# Patient Record
Sex: Female | Born: 2000 | Race: Black or African American | Hispanic: No | Marital: Single | Smoking: Never smoker
Health system: Southern US, Community
[De-identification: ages and names within clinical notes are randomized; demographics above are authoritative.]

## PROBLEM LIST (undated history)

## (undated) DIAGNOSIS — L309 Dermatitis, unspecified: Secondary | ICD-10-CM

## (undated) DIAGNOSIS — J45909 Unspecified asthma, uncomplicated: Secondary | ICD-10-CM

## (undated) HISTORY — DX: Unspecified asthma, uncomplicated: J45.909

## (undated) HISTORY — DX: Dermatitis, unspecified: L30.9

---
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
## 2021-05-19 ENCOUNTER — Other Ambulatory Visit: Payer: Self-pay

## 2021-05-19 ENCOUNTER — Encounter (HOSPITAL_COMMUNITY): Payer: Self-pay | Admitting: Emergency Medicine

## 2021-05-19 ENCOUNTER — Emergency Department (HOSPITAL_COMMUNITY)
Admission: EM | Admit: 2021-05-19 | Discharge: 2021-05-19 | Disposition: A | Discharge status: Home / Self Care | Payer: 59 | Attending: Emergency Medicine | Admitting: Emergency Medicine

## 2021-05-19 DIAGNOSIS — Z7951 Long term (current) use of inhaled steroids: Secondary | ICD-10-CM | POA: Insufficient documentation

## 2021-05-19 DIAGNOSIS — Z91010 Allergy to peanuts: Secondary | ICD-10-CM | POA: Diagnosis not present

## 2021-05-19 DIAGNOSIS — J4521 Mild intermittent asthma with (acute) exacerbation: Secondary | ICD-10-CM | POA: Insufficient documentation

## 2021-05-19 DIAGNOSIS — R0602 Shortness of breath: Secondary | ICD-10-CM | POA: Diagnosis present

## 2021-05-19 DIAGNOSIS — J4520 Mild intermittent asthma, uncomplicated: Secondary | ICD-10-CM

## 2021-05-19 MED ORDER — ALBUTEROL SULFATE HFA 108 (90 BASE) MCG/ACT IN AERS
2.0000 | INHALATION_SPRAY | Freq: Once | RESPIRATORY_TRACT | Status: AC
  Administered 2021-05-19: 2 via RESPIRATORY_TRACT
  Filled 2021-05-19: qty 6.7

## 2021-05-19 NOTE — Discharge Instructions (Signed)
Can follow-up with wellness clinic. Can continue using inhaler when needed.  8 puffs= 1 neb treatment. Return here for new concerns.

## 2021-05-19 NOTE — ED Triage Notes (Signed)
Pt reports her asthma is acting up and she does not have an inhaler.  No other symptoms at this time.

## 2021-05-19 NOTE — ED Provider Notes (Signed)
[facility] EMERGENCY DEPARTMENT Provider Note   CSN: [id number] Arrival date & time: 05/19/21  0500     History Chief Complaint  Patient presents with   Shortness of Breath    Kathy Stafford is a 20 y.o. female.  The history is provided by the patient and medical records.  Shortness of Breath  20 y.o. F with hx of asthma here with SOB.  States asthma has been acting up recently after the rain.  She does not currently have albuterol inhaler nor primary care doctor.  No recent fever/illness.    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 No pertinent surgical history.   OB History   No obstetric history on file.     Family History  Problem Relation Age of Onset   Lung cancer Maternal Grandmother    Heart disease Maternal Grandmother    Hypertension Maternal Grandmother    Breast cancer Paternal Grandmother    Diabetes Paternal Grandmother    Diabetes Paternal Grandfather     Social History   Tobacco Use   Smoking status: Never   Smokeless tobacco: Never  Vaping Use   Vaping Use: Never used  Substance Use Topics   Alcohol use: No   Drug use: No    Home Medications Prior to Admission medications   Medication Sig Start Date End Date Taking? Authorizing Provider  albuterol (PROAIR HFA) 108 (90 Base) MCG/ACT inhaler Inhale 2 puffs into the lungs every 4 (four) hours as needed. 09/07/15   Kozlow, Alvira Philips, MD  diphenhydrAMINE (BENADRYL) 25 MG tablet Take 2 tablets (50 mg total) by mouth every 6 (six) hours as needed for itching (Please take every 6 hours for the next 24 hours, then every 6 hours as needed.). 08/22/13   Elwin Mocha, MD  EPINEPHrine (AUVI-Q) 0.3 mg/0.3 mL IJ SOAJ injection Use as  directed for severe allergic reactions 07/21/19   Bobbitt, Heywood Iles, MD  fluticasone [facility]) 50 MCG/ACT nasal spray Place 2 sprays into both nostrils daily. 06/11/18   Bobbitt, Heywood Iles, MD  levocetirizine (XYZAL) 5 MG tablet Take 1 tablet (5 mg total) by mouth every evening. 06/11/18   Bobbitt, Heywood Iles, MD  loratadine (CLARITIN) 10 MG tablet Take 10 mg by mouth daily as needed.    [provider]    Allergies    Other and Peanuts [peanut oil]  Review of Systems   Review of Systems  Respiratory:  Positive for shortness of breath.   All other systems reviewed and are negative.  Physical Exam Updated Vital Signs BP 128/85 (BP Location: Right Arm)   Pulse 82   Temp 97.6 F (36.4 C) (Oral)   Resp 16   SpO2 99%   Physical Exam Vitals and nursing note reviewed.  Constitutional:      Appearance: She is well-developed.  HENT:     Head: Normocephalic and atraumatic.  Eyes:     Conjunctiva/sclera: Conjunctivae normal.     Pupils: Pupils are equal, round, and reactive to light.  Cardiovascular:     Rate and Rhythm: Normal rate and regular rhythm.     Heart sounds: Normal heart sounds.  Pulmonary:     Effort: Pulmonary effort is  normal.     Breath sounds: Wheezing present.     Comments: Expiratory wheezes, NAD, speaking in full sentences without difficulty Abdominal:     General: Bowel sounds are normal.     Palpations: Abdomen is soft.  Musculoskeletal:        General: Normal range of motion.     Cervical back: Normal range of motion.  Skin:    General: Skin is warm and dry.  Neurological:     Mental Status: She is alert and oriented to person, place, and time.    ED Results / Procedures / Treatments   Labs (all labs ordered are listed, but only abnormal results are displayed) Labs Reviewed - No data to display  EKG None  Radiology No results found.  Procedures Procedures   Medications Ordered in ED Medications  albuterol (VENTOLIN HFA)  108 (90 Base) MCG/ACT inhaler 2 puff (2 puffs Inhalation Given 05/19/21 0521)    ED Course  I have reviewed the triage vital signs and the nursing notes.  Pertinent labs & imaging results that were available during my care of the patient were reviewed by me and considered in my medical decision making (see chart for details).    MDM Rules/Calculators/A&P                           20 year old female here with asthma exacerbation.  Has been having issues of the past few days since all the rain.  She does not currently have an inhaler nor primary care doctor.  She is afebrile and nontoxic in here.  Her vitals are stable on room air.  She does have some expiratory wheezes but is in no acute respiratory distress.  She was given albuterol inhaler here and use several puffs and reports she is feeling better.  Will refer to wellness clinic for follow-up.  Work note provided.  Return here for new concerns.  Final Clinical Impression(s) / ED Diagnoses Final diagnoses:  Mild intermittent asthma without complication    Rx / DC Orders ED Discharge Orders     None        Garlon Hatchet, PA-C 05/19/21 0534    Zadie Rhine, MD 05/19/21 [phone]

## 2021-07-01 IMAGING — DX DG CHEST 1V PORT
1 series · 1 of 1 positions shown · non-contrast
Comparison: . chest radiograph dated 04/10/2013

CLINICAL DATA: 18-year-old female with hyperventilation

EXAM:
PORTABLE CHEST 1 VIEW

[chest ap]
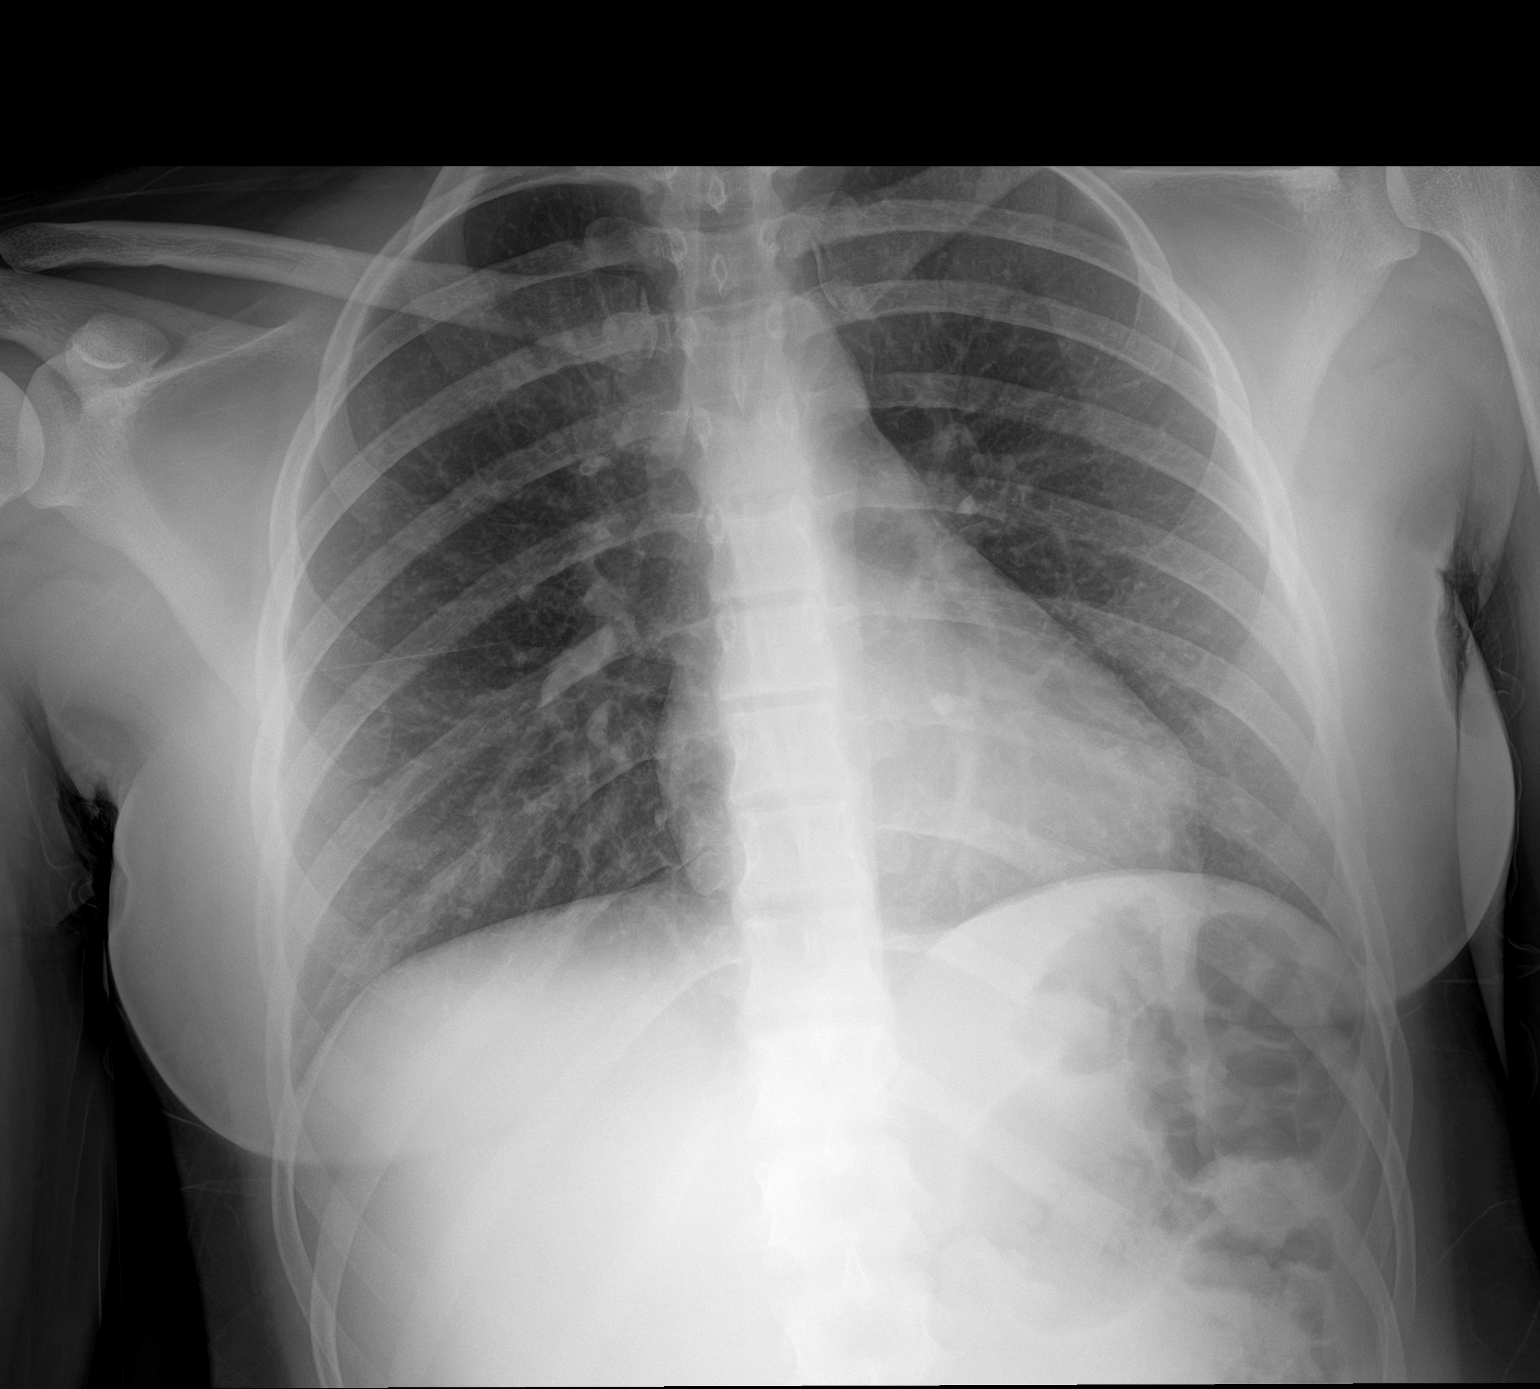

[1 of 1 positions shown; findings below may reference images not displayed]

FINDINGS: The heart size and mediastinal contours are within normal limits.
Both lungs are clear. The visualized skeletal structures are
unremarkable.
IMPRESSION: No active disease.

## 2021-07-26 ENCOUNTER — Emergency Department (HOSPITAL_COMMUNITY)
Admission: EM | Admit: 2021-07-26 | Discharge: 2021-07-26 | Disposition: A | Discharge status: Home / Self Care | Payer: No Typology Code available for payment source | Attending: Emergency Medicine | Admitting: Emergency Medicine

## 2021-07-26 DIAGNOSIS — Z91010 Allergy to peanuts: Secondary | ICD-10-CM | POA: Insufficient documentation

## 2021-07-26 DIAGNOSIS — Z79899 Other long term (current) drug therapy: Secondary | ICD-10-CM | POA: Insufficient documentation

## 2021-07-26 DIAGNOSIS — T782XXA Anaphylactic shock, unspecified, initial encounter: Secondary | ICD-10-CM | POA: Insufficient documentation

## 2021-07-26 DIAGNOSIS — J45909 Unspecified asthma, uncomplicated: Secondary | ICD-10-CM | POA: Insufficient documentation

## 2021-07-26 DIAGNOSIS — R0602 Shortness of breath: Secondary | ICD-10-CM | POA: Diagnosis present

## 2021-07-26 LAB — I-STAT BETA HCG BLOOD, ED (MC, WL, AP ONLY): I-stat hCG, quantitative: <5 m[IU]/mL (ref <5)

## 2021-07-26 MED ORDER — DIPHENHYDRAMINE HCL 50 MG/ML IJ SOLN
25.0000 mg | Freq: Once | INTRAMUSCULAR | Status: AC
  Administered 2021-07-26: 25 mg via INTRAVENOUS
  Filled 2021-07-26: qty 1

## 2021-07-26 MED ORDER — FAMOTIDINE IN NACL 20-0.9 MG/50ML-% IV SOLN
20.0000 mg | Freq: Once | INTRAVENOUS | Status: AC
  Administered 2021-07-26: 20 mg via INTRAVENOUS
  Filled 2021-07-26: qty 50

## 2021-07-26 MED ORDER — EPINEPHRINE 0.3 MG/0.3ML IJ SOAJ: 0.3000 mg | Freq: Once | INTRAMUSCULAR | 1 refills | Status: DC | PRN

## 2021-07-26 NOTE — Discharge Instructions (Addendum)
You were evaluated in the Emergency Department and after careful evaluation, we did not find any emergent condition requiring admission or further testing in the hospital.  Your exam/testing today was overall reassuring. You were treated in the ED for anaphylaxis. After obervsation, your symptoms have improved.  A new prescription for an Epi Pen has been provided.   Please return to the Emergency Department if you experience any worsening of your condition.  Thank you for allowing [place] to be a part of your care.

## 2021-07-26 NOTE — ED Triage Notes (Signed)
Pt BIB [facility] for an allergic reaction. Pt works at [employer] and has a known dust/pollen/peanut allergy.  PT had hives, itching, and swelling of eyelids, tongue and lips. Pt also had some wheezing and nausea.   Pt was given albuterol, epi, Benadryl, zofran, and solu-medrol.  Symptoms had improved but not fully resolved on arrival. Pt was experiencing a resurgence of itching and nausea.

## 2021-07-26 NOTE — ED Provider Notes (Signed)
[facility] EMERGENCY DEPARTMENT Provider Note   CSN: [id number] Arrival date & time: 07/26/21  2001     History No chief complaint on file.   Kathy Stafford is a 20 y.o. female.  The history is provided by the patient.  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   OB History   No obstetric history on file.     Family History  Problem Relation Age of Onset   Lung cancer Maternal Grandmother    Heart disease Maternal Grandmother    Hypertension Maternal Grandmother    Breast cancer Paternal Grandmother    Diabetes Paternal Grandmother    Diabetes Paternal Grandfather     Social History   Tobacco Use   Smoking status: Never   Smokeless tobacco: Never  Vaping Use   Vaping Use: Never used  Substance Use Topics   Alcohol use: No   Drug use: No    Home Medications Prior to Admission medications   Medication Sig Start Date End Date Taking? Authorizing Provider  albuterol (PROAIR HFA) 108 (90 Base) MCG/ACT inhaler Inhale 2 puffs into the lungs every 4 (four) hours as needed. Patient taking differently: Inhale 2 puffs into the lungs every 4 (four) hours as needed for wheezing or shortness of breath. 09/07/15  Yes Kozlow, Alvira Philips, MD  diphenhydrAMINE (BENADRYL) 25 MG tablet Take 2 tablets (50 mg total) by mouth every 6 (six) hours as needed for itching (Please take every 6 hours for the next 24 hours, then every 6  hours as needed.). 08/22/13  Yes Elwin Mocha, MD  fluticasone [facility]) 50 MCG/ACT nasal spray Place 2 sprays into both nostrils daily. Patient taking differently: Place 2 sprays into both nostrils daily as needed for allergies. 06/11/18  Yes Bobbitt, Heywood Iles, MD  guaiFENesin (MUCINEX) 600 MG 12 hr tablet Take 600 mg by mouth 2 (two) times daily as needed for cough.   Yes [provider]  Hyprom-Naphaz-Polysorb-Zn Sulf (CLEAR EYES COMPLETE) SOLN Place 1 drop into both eyes daily as needed (redness).   Yes [provider]  loratadine (CLARITIN) 10 MG tablet Take 10 mg by mouth daily as needed for allergies.   Yes [provider]  EPINEPHrine (AUVI-Q) 0.3 mg/0.3 mL IJ SOAJ injection Inject 0.3 mg into the muscle once as needed for anaphylaxis. Use as directed for severe allergic reactions 07/26/21   Ernie Avena, MD  levocetirizine (XYZAL) 5 MG tablet Take 1 tablet (5 mg total) by mouth every evening. Patient not taking: Reported on 07/26/2021 06/11/18   Bobbitt, Heywood Iles, MD    Allergies    Other and Peanuts [peanut oil]  Review of Systems   Review of Systems  Constitutional:  Negative for chills and fever.  HENT:  Positive for facial swelling. Negative for ear pain and sore throat.   Eyes:  Negative for pain and visual disturbance.  Respiratory:  Positive for wheezing. Negative for cough and shortness of  breath.   Cardiovascular:  Negative for chest pain and palpitations.  Gastrointestinal:  Positive for nausea. Negative for abdominal pain and vomiting.  Genitourinary:  Negative for dysuria and hematuria.  Musculoskeletal:  Negative for arthralgias and back pain.  Skin:  Positive for itching and rash. Negative for color change.  Neurological:  Negative for seizures and syncope.  All other systems reviewed and are negative.  Physical Exam Updated Vital Signs BP 112/65   Pulse 83   Temp 98.8 F (37.1 C) (Oral)   Resp 20   SpO2 100%   Physical  Exam Vitals and nursing note reviewed.  Constitutional:      General: She is not in acute distress.    Appearance: She is well-developed.  HENT:     Head: Normocephalic and atraumatic.     Comments: Left-sided periorbital and eyelid edema, mild left lip swelling Eyes:     Conjunctiva/sclera: Conjunctivae normal.  Cardiovascular:     Rate and Rhythm: Normal rate and regular rhythm.     Heart sounds: No murmur heard. Pulmonary:     Effort: Pulmonary effort is normal. No respiratory distress.     Breath sounds: Normal breath sounds. No wheezing.  Abdominal:     Palpations: Abdomen is soft.     Tenderness: There is no abdominal tenderness.  Musculoskeletal:        General: No swelling.     Cervical back: Neck supple.  Skin:    General: Skin is warm and dry.     Capillary Refill: Capillary refill takes less than 2 seconds.     Findings: Rash present.     Comments: Resolving urticarial rash  Neurological:     Mental Status: She is alert.  Psychiatric:        Mood and Affect: Mood normal.    ED Results / Procedures / Treatments   Labs (all labs ordered are listed, but only abnormal results are displayed) Labs Reviewed  I-STAT BETA HCG BLOOD, ED ([facility], [facility], [facility] ONLY)    EKG None  Radiology No results found.  Procedures .Critical Care Performed by: Regan Lemming, MD Authorized by: Regan Lemming, MD   Critical care provider statement:    Critical care time (minutes):  60   Critical care was necessary to treat or prevent imminent or life-threatening deterioration of the following conditions: Anaphylaxis.   Critical care was time spent personally by me on the following activities:  Examination of patient, obtaining history from patient or surrogate, ordering and performing treatments and interventions and re-evaluation of patient's condition   Medications Ordered in ED Medications  diphenhydrAMINE (BENADRYL) injection 25 mg (25 mg Intravenous Given 07/26/21 2034)   famotidine (PEPCID) IVPB 20 mg premix (0 mg Intravenous Stopped 07/26/21 2108)    ED Course  I have reviewed the triage vital signs and the nursing notes.  Pertinent labs & imaging results that were available during my care of the patient were reviewed by me and considered in my medical decision making (see chart for details).    MDM Rules/Calculators/A&P                           20 year old female presenting to the emergency department with concern for anaphylaxis.  The patient states she has a dust and known peanut allergy.  She had no known exposures today but developed sudden onset urticarial rash with associated itching, swelling of her eyelids, tongue and lips with associated wheezing and nausea.  EMS was called out and the patient was administered an albuterol nebulizer for wheezing, and EpiPen IM and administered Zofran and Solu-Medrol.  She was then transported to [facility] for further care and management where she arrived ABC intact, GCS 15.  She had somewhat improved but persistent eyelid edema and mild lip swelling on arrival.  No noted tongue swelling as it appears to have resolved.  Wheezing has also resolved.  Still endorsing persistent nausea.  IV access was obtained and the patient was administered IV Benadryl and IV Pepcid.  The patient was observed for 3 and half hours in the emergency department with no recurrence of symptoms.  Overall feeling much improved on reassessment.  Stable for discharge with prescription for EpiPen provided.  Final Clinical Impression(s) / ED Diagnoses Final diagnoses:  Anaphylaxis, initial encounter    Rx / DC Orders ED Discharge Orders          Ordered    EPINEPHrine (AUVI-Q) 0.3 mg/0.3 mL IJ SOAJ injection  Once PRN        07/26/21 2329             Regan Lemming, MD 07/26/21 2331

## 2021-07-27 MED ORDER — ALBUTEROL SULFATE HFA 108 (90 BASE) MCG/ACT IN AERS: 2.0000 | INHALATION_SPRAY | Freq: Once | RESPIRATORY_TRACT | Status: DC

## 2022-02-12 ENCOUNTER — Encounter (HOSPITAL_COMMUNITY): Payer: Self-pay | Admitting: Emergency Medicine

## 2022-02-12 ENCOUNTER — Ambulatory Visit (HOSPITAL_COMMUNITY)
Admission: EM | Admit: 2022-02-12 | Discharge: 2022-02-12 | Disposition: A | Discharge status: Home / Self Care | Payer: Self-pay | Attending: Physician Assistant | Admitting: Physician Assistant

## 2022-02-12 DIAGNOSIS — J4541 Moderate persistent asthma with (acute) exacerbation: Secondary | ICD-10-CM

## 2022-02-12 DIAGNOSIS — R0602 Shortness of breath: Secondary | ICD-10-CM

## 2022-02-12 DIAGNOSIS — J069 Acute upper respiratory infection, unspecified: Secondary | ICD-10-CM

## 2022-02-12 MED ORDER — ALBUTEROL SULFATE (2.5 MG/3ML) 0.083% IN NEBU
INHALATION_SOLUTION | RESPIRATORY_TRACT | Status: AC
  Filled 2022-02-12: qty 3

## 2022-02-12 MED ORDER — ALBUTEROL SULFATE (2.5 MG/3ML) 0.083% IN NEBU
2.5000 mg | INHALATION_SOLUTION | RESPIRATORY_TRACT | Status: DC
  Administered 2022-02-12: 2.5 mg via RESPIRATORY_TRACT

## 2022-02-12 MED ORDER — ALBUTEROL SULFATE HFA 108 (90 BASE) MCG/ACT IN AERS: 2.0000 | INHALATION_SPRAY | RESPIRATORY_TRACT | 1 refills | Status: DC | PRN

## 2022-02-12 MED ORDER — TRIAMCINOLONE ACETONIDE 40 MG/ML IJ SUSP
INTRAMUSCULAR | Status: AC
  Filled 2022-02-12: qty 1

## 2022-02-12 MED ORDER — TRIAMCINOLONE ACETONIDE 40 MG/ML IJ SUSP
40.0000 mg | Freq: Once | INTRAMUSCULAR | Status: AC
  Administered 2022-02-12: 40 mg via INTRAMUSCULAR

## 2022-02-12 NOTE — ED Triage Notes (Signed)
Pt reports had asthma attack earlier today and doesn't have inhaler. Pt reports still feels SOB.

## 2022-02-12 NOTE — Discharge Instructions (Addendum)
Advised patient to continue taking Mucinex for the chest congestion. Asked patient to use the albuterol inhaler 2 puffs every 4-6 hours as needed for the wheezing and shortness of breath. Patient to increase the fluid intake. Advised patient follow-up with PCP or return to urgent care if symptoms fail to improve

## 2022-02-12 NOTE — ED Provider Notes (Signed)
[facility]    CSN: [id number] Arrival date & time: 02/12/22  1707      History   Chief Complaint Chief Complaint  Patient presents with   Asthma    HPI Kathy Stafford is a 21 y.o. female.   21 year old female presents with asthma exacerbation shortness of breath.  Patient relates that recently she has had upper respiratory infection which has resolved.  Patient relates that today she started having episode of wheezing, and shortness of breath.  Patient relates that she ran out of her albuterol inhaler so she was not able to use this to help control the asthma flare.  Patient relates that she still continues to have shortness of breath and wheezing, still continues with chest congestion.  Patient relates that her production is thick, and yellow.  Patient relates that she is not having fever or chills, no nausea or vomiting.  Patient relates that she does have some mild fatigue and would like to be able to stay at home tomorrow to be able to treat her respiratory and asthma condition.   Asthma Associated symptoms include shortness of breath.    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 No pertinent surgical history.  OB History   No obstetric history on file.      Home Medications    Prior to Admission medications   Medication Sig Start Date End Date Taking? Authorizing Provider  albuterol (PROAIR HFA) 108 (90 Base) MCG/ACT inhaler Inhale 2 puffs into the lungs every 4 (four) hours as needed for wheezing or shortness of breath. 02/12/22   Ellsworth Lennox, PA-C  diphenhydrAMINE (BENADRYL) 25 MG tablet Take 2 tablets (50 mg total) by mouth every 6 (six) hours as needed for itching (Please take every 6 hours for the next  24 hours, then every 6 hours as needed.). 08/22/13   Elwin Mocha, MD  EPINEPHrine (AUVI-Q) 0.3 mg/0.3 mL IJ SOAJ injection Inject 0.3 mg into the muscle once as needed for anaphylaxis. Use as directed for severe allergic reactions 07/26/21   Ernie Avena, MD  fluticasone [facility]) 50 MCG/ACT nasal spray Place 2 sprays into both nostrils daily. Patient taking differently: Place 2 sprays into both nostrils daily as needed for allergies. 06/11/18   Bobbitt, Heywood Iles, MD  guaiFENesin (MUCINEX) 600 MG 12 hr tablet Take 600 mg by mouth 2 (two) times daily as needed for cough.    [provider]  Hyprom-Naphaz-Polysorb-Zn Sulf (CLEAR EYES COMPLETE) SOLN Place 1 drop into both eyes daily as needed (redness).    [provider]  levocetirizine (XYZAL) 5 MG tablet Take 1 tablet (5 mg total) by mouth every evening. Patient not taking: Reported on 07/26/2021 06/11/18   Bobbitt, Heywood Iles, MD  loratadine (CLARITIN) 10 MG tablet Take 10 mg by mouth daily as needed for allergies.    [provider]    Family History Family History  Problem Relation Age of Onset   Lung cancer Maternal Grandmother    Heart disease Maternal Grandmother    Hypertension Maternal Grandmother    Breast cancer Paternal Grandmother    Diabetes Paternal Grandmother    Diabetes Paternal Grandfather     Social History Social History  Tobacco Use   Smoking status: Never   Smokeless tobacco: Never  Vaping Use   Vaping Use: Never used  Substance Use Topics   Alcohol use: No   Drug use: No     Allergies   Other and Peanuts [peanut oil]   Review of Systems Review of Systems  Respiratory:  Positive for cough, shortness of breath and wheezing.      Physical Exam Triage Vital Signs ED Triage Vitals  Enc Vitals Group     BP 02/12/22 1752 119/79     Pulse Rate 02/12/22 1752 72     Resp 02/12/22 1752 14     Temp 02/12/22 1751 98.6 F (37 C)     Temp src --      SpO2 02/12/22  1752 97 %     Weight --      Height --      Head Circumference --      Peak Flow --      Pain Score 02/12/22 1750 6     Pain Loc --      Pain Edu? --      Excl. in GC? --    No data found.  Updated Vital Signs BP 119/79 (BP Location: Left Arm)   Pulse 72   Temp 98.6 F (37 C)   Resp 14   LMP 01/29/2022   SpO2 97%   Visual Acuity Right Eye Distance:   Left Eye Distance:   Bilateral Distance:    Right Eye Near:   Left Eye Near:    Bilateral Near:     Physical Exam Constitutional:      Appearance: Normal appearance.  HENT:     Right Ear: Tympanic membrane and ear canal normal.     Left Ear: Tympanic membrane and ear canal normal.     Mouth/Throat:     Mouth: Mucous membranes are moist.     Pharynx: Oropharynx is clear. Uvula midline. No posterior oropharyngeal erythema.  Cardiovascular:     Rate and Rhythm: Normal rate and regular rhythm.     Heart sounds: Normal heart sounds.  Pulmonary:     Effort: Pulmonary effort is normal.     Breath sounds: Normal air entry. Examination of the left-upper field reveals wheezing and rhonchi. Examination of the right-middle field reveals wheezing and rhonchi. Wheezing and rhonchi present. No rales.     Comments: Lungs: After albuterol nebulizer treatment the lungs cleared well, the rhonchi and wheezes resolved both lungs all lobes. Neurological:     Mental Status: She is alert.      UC Treatments / Results  Labs (all labs ordered are listed, but only abnormal results are displayed) Labs Reviewed - No data to display  EKG   Radiology No results found.  Procedures Procedures (including critical care time)  Medications Ordered in UC Medications  albuterol (PROVENTIL) (2.5 MG/3ML) 0.083% nebulizer solution 2.5 mg (2.5 mg Nebulization Given 02/12/22 1831)  triamcinolone acetonide (KENALOG-40) injection 40 mg (40 mg Intramuscular Given 02/12/22 1828)    Initial Impression / Assessment and Plan / UC Course  I have  reviewed the triage vital signs and the nursing notes.  Pertinent labs & imaging results that were available during my care of the patient were reviewed by me and considered in my medical decision making (see chart for details).    Plan: 1.  Advised patient to use albuterol inhaler 2 puffs every 4-6 hours as needed for wheezing and shortness of breath to  help control the asthma 2.  Advised to continue using Mucinex to help thin the secretions. 3.  Advised to take Tylenol or ibuprofen if needed for fever. 4.  Advised to follow-up PCP or return to urgent care if symptoms fail to improve. Final Clinical Impressions(s) / UC Diagnoses   Final diagnoses:  Moderate persistent asthma with exacerbation  Shortness of breath  Viral upper respiratory tract infection     Discharge Instructions      Advised patient to continue taking Mucinex for the chest congestion. Asked patient to use the albuterol inhaler 2 puffs every 4-6 hours as needed for the wheezing and shortness of breath. Patient to increase the fluid intake. Advised patient follow-up with PCP or return to urgent care if symptoms fail to improve    ED Prescriptions     Medication Sig Dispense Auth. Provider   albuterol (PROAIR HFA) 108 (90 Base) MCG/ACT inhaler Inhale 2 puffs into the lungs every 4 (four) hours as needed for wheezing or shortness of breath. 18 g Ellsworth Lennox, PA-C      PDMP not reviewed this encounter.   Ellsworth Lennox, PA-C 02/12/22 1859

## 2022-03-05 ENCOUNTER — Ambulatory Visit (HOSPITAL_COMMUNITY)
Admission: EM | Admit: 2022-03-05 | Discharge: 2022-03-05 | Disposition: A | Discharge status: Home / Self Care | Payer: Self-pay | Attending: Family Medicine | Admitting: Family Medicine

## 2022-03-05 ENCOUNTER — Encounter (HOSPITAL_COMMUNITY): Payer: Self-pay | Admitting: *Deleted

## 2022-03-05 ENCOUNTER — Other Ambulatory Visit: Payer: Self-pay

## 2022-03-05 DIAGNOSIS — Z4802 Encounter for removal of sutures: Secondary | ICD-10-CM

## 2022-03-05 NOTE — ED Provider Notes (Signed)
Erroneous encounter. Patient was not evaluated by provider.    Rhys Martini, PA-C 03/05/22 1542

## 2022-03-05 NOTE — ED Triage Notes (Signed)
Pt presents for suture removal on face.

## 2022-03-05 NOTE — Discharge Instructions (Addendum)
Nurse visit only

## 2022-05-02 ENCOUNTER — Encounter: Payer: Self-pay | Admitting: Radiology

## 2023-09-11 ENCOUNTER — Ambulatory Visit
Admission: EM | Admit: 2023-09-11 | Discharge: 2023-09-11 | Disposition: A | Discharge status: Home / Self Care | Payer: Medicaid Other | Attending: Family Medicine | Admitting: Family Medicine

## 2023-09-11 ENCOUNTER — Ambulatory Visit (INDEPENDENT_AMBULATORY_CARE_PROVIDER_SITE_OTHER): Payer: Medicaid Other

## 2023-09-11 DIAGNOSIS — R051 Acute cough: Secondary | ICD-10-CM

## 2023-09-11 DIAGNOSIS — J4521 Mild intermittent asthma with (acute) exacerbation: Secondary | ICD-10-CM

## 2023-09-11 DIAGNOSIS — J209 Acute bronchitis, unspecified: Secondary | ICD-10-CM

## 2023-09-11 LAB — POC COVID19/FLU A&B COMBO
Covid Antigen, POC: NEGATIVE
Influenza A Antigen, POC: NEGATIVE
Influenza B Antigen, POC: NEGATIVE

## 2023-09-11 MED ORDER — AZITHROMYCIN 250 MG PO TABS: 250.0000 mg | ORAL_TABLET | Freq: Every day | ORAL | 0 refills | Status: DC

## 2023-09-11 MED ORDER — PREDNISONE 20 MG PO TABS: 40.0000 mg | ORAL_TABLET | Freq: Every day | ORAL | 0 refills | Status: AC

## 2023-09-11 MED ORDER — PROMETHAZINE-DM 6.25-15 MG/5ML PO SYRP: 5.0000 mL | ORAL_SOLUTION | Freq: Four times a day (QID) | ORAL | 0 refills | Status: DC | PRN

## 2023-09-11 NOTE — ED Provider Notes (Signed)
 [facility]    CSN: [id number] Arrival date & time: 09/11/23  1706      History   Chief Complaint Chief Complaint  Patient presents with   Chills   Nasal Congestion    HPI Kathy Stafford is a 23 y.o. female  presents for evaluation of URI symptoms for 1 days. Patient reports associated symptoms of fever, chills, headache, runny nose, shortness of breath.  Reports she has had a cough for 2 months that is productive and seems to have worsened today.  Denies N/V/D, ear pain, sore throat, body aches. Patient does have a hx of asthma.  Has an albuterol  inhaler that she has been using with temporary improvement in her symptoms.  Patient is not an active smoker.   Reports  sick contacts via work.  Pt has taken nothing OTC for symptoms. Pt has no other concerns at this time.   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 No pertinent surgical history.  OB History   No obstetric history on file.      Home Medications    Prior to Admission medications   Medication Sig Start Date End Date Taking? Authorizing Provider  azithromycin  (ZITHROMAX ) 250 MG tablet Take 1 tablet (250 mg total) by mouth daily. Take first 2 tablets together, then 1 every day until finished. 09/11/23  Yes Avish Torry, Jodi R, NP  predniSONE  (DELTASONE ) 20 MG tablet Take 2 tablets (40 mg total) by mouth daily with breakfast for 5 days. 09/11/23 09/16/23 Yes Kaleb Linquist, Jodi R, NP  promethazine -dextromethorphan (PROMETHAZINE -DM) 6.25-15 MG/5ML syrup Take 5 mLs by mouth 4 (four) times daily as needed for cough. 09/11/23  Yes Astella Desir, Jodi R, NP  albuterol  (PROAIR  HFA) 108 (90 Base) MCG/ACT inhaler Inhale 2 puffs into the lungs every 4 (four) hours as needed for wheezing or  shortness of breath. 02/12/22   Lynwood Lenis, PA-C  diphenhydrAMINE  (BENADRYL ) 25 MG tablet Take 2 tablets (50 mg total) by mouth every 6 (six) hours as needed for itching (Please take every 6 hours for the next 24 hours, then every 6 hours as needed.). 08/22/13   Elpidio Lamp, MD  EPINEPHrine  (AUVI-Q ) 0.3 mg/0.3 mL IJ SOAJ injection Inject 0.3 mg into the muscle once as needed for anaphylaxis. Use as directed for severe allergic reactions 07/26/21   Jerrol Lynwood, MD  fluticasone  (FLONASE ) 50 MCG/ACT nasal spray Place 2 sprays into both nostrils daily. Patient taking differently: Place 2 sprays into both nostrils daily as needed for allergies. 06/11/18   Bobbitt, Elgin Pepper, MD  guaiFENesin  (MUCINEX ) 600 MG 12 hr tablet Take 600 mg by mouth 2 (two) times daily as needed for cough.    [provider]  Hyprom-Naphaz-Polysorb-Zn Sulf (CLEAR EYES COMPLETE) SOLN Place 1 drop into both eyes daily as needed (redness).    [provider]  levocetirizine (XYZAL ) 5 MG tablet Take 1 tablet (5 mg total) by mouth every evening. Patient not taking: Reported on 07/26/2021 06/11/18   Bobbitt, Elgin Pepper, MD  loratadine (CLARITIN) 10 MG tablet Take 10 mg by mouth daily as needed for allergies.    [provider]    Family History Family History  Problem Relation Age of Onset  Lung cancer Maternal Grandmother    Heart disease Maternal Grandmother    Hypertension Maternal Grandmother    Breast cancer Paternal Grandmother    Diabetes Paternal Grandmother    Diabetes Paternal Grandfather     Social History Social History   Tobacco Use   Smoking status: Never   Smokeless tobacco: Never  Vaping Use   Vaping status: Never Used  Substance Use Topics   Alcohol use: No   Drug use: No     Allergies   Other and Peanuts [peanut oil]   Review of Systems Review of Systems  Constitutional:  Positive for chills and fever.  HENT:  Positive for congestion.   Respiratory:   Positive for cough and shortness of breath.   Neurological:  Positive for headaches.     Physical Exam Triage Vital Signs ED Triage Vitals  Encounter Vitals Group     BP 09/11/23 1715 113/72     Systolic BP Percentile --      Diastolic BP Percentile --      Pulse Rate 09/11/23 1715 89     Resp 09/11/23 1715 17     Temp 09/11/23 1715 99.5 F (37.5 C)     Temp Source 09/11/23 1715 Oral     SpO2 09/11/23 1715 98 %     Weight --      Height --      Head Circumference --      Peak Flow --      Pain Score 09/11/23 1714 3     Pain Loc --      Pain Education --      Exclude from Growth Chart --    No data found.  Updated Vital Signs BP 113/72 (BP Location: Right Arm)   Pulse 89   Temp 99.5 F (37.5 C) (Oral)   Resp 17   LMP 08/05/2023 (Exact Date)   SpO2 98%   Visual Acuity Right Eye Distance:   Left Eye Distance:   Bilateral Distance:    Right Eye Near:   Left Eye Near:    Bilateral Near:     Physical Exam Vitals and nursing note reviewed.  Constitutional:      General: She is not in acute distress.    Appearance: She is well-developed. She is not ill-appearing.  HENT:     Head: Normocephalic and atraumatic.     Right Ear: Tympanic membrane and ear canal normal.     Left Ear: Tympanic membrane and ear canal normal.     Nose: Congestion present.     Mouth/Throat:     Mouth: Mucous membranes are moist.     Pharynx: Oropharynx is clear. Uvula midline. No oropharyngeal exudate or posterior oropharyngeal erythema.     Tonsils: No tonsillar exudate or tonsillar abscesses.  Eyes:     Conjunctiva/sclera: Conjunctivae normal.     Pupils: Pupils are equal, round, and reactive to light.  Cardiovascular:     Rate and Rhythm: Normal rate and regular rhythm.     Heart sounds: Normal heart sounds.  Pulmonary:     Effort: Pulmonary effort is normal.     Breath sounds: Normal breath sounds. No wheezing or rhonchi.  Musculoskeletal:     Cervical back: Normal range of  motion and neck supple.  Lymphadenopathy:     Cervical: No cervical adenopathy.  Skin:    General: Skin is warm and dry.  Neurological:     General: No focal deficit present.  Mental Status: She is alert and oriented to person, place, and time.  Psychiatric:        Mood and Affect: Mood normal.        Behavior: Behavior normal.      UC Treatments / Results  Labs (all labs ordered are listed, but only abnormal results are displayed) Labs Reviewed  POC COVID19/FLU A&B COMBO    EKG   Radiology DG Chest 2 View Result Date: 09/11/2023 CLINICAL DATA:  Cough for several months. EXAM: CHEST - 2 VIEW COMPARISON:  X-ray 05/26/2019 and older FINDINGS: Mild bronchial wall thickening. No consolidation, pneumothorax or effusion. Normal cardiopericardial silhouette. IMPRESSION: Mild peribronchial thickening.  No consolidation or effusion Electronically Signed   By: Ranell Bring M.D.   On: 09/11/2023 17:46    Procedures Procedures (including critical care time)  Medications Ordered in UC Medications - No data to display  Initial Impression / Assessment and Plan / UC Course  I have reviewed the triage vital signs and the nursing notes.  Pertinent labs & imaging results that were available during my care of the patient were reviewed by me and considered in my medical decision making (see chart for details).     Reviewed exam and symptoms with patient.  Negative rapid flu and COVID.  Chest x-ray negative for pneumonia does show mild peribronchial thickening.  Given her length of cough for 2 weeks with new onset fever will treat for bronchitis with Zithromax .  Will also treat asthma with prednisone .  She is to continue inhalers as needed.  Promethazine  DM as needed for cough, side effect profile reviewed.  PCP follow-up 2 days for recheck.  Strict ER precautions reviewed. Final Clinical Impressions(s) / UC Diagnoses   Final diagnoses:  Acute cough  Acute bronchitis, unspecified organism   Mild intermittent asthma with acute exacerbation     Discharge Instructions      Start Zithromax  and prednisone  as prescribed.  You may take Promethazine  DM as needed for cough.  Please note this medication can make you drowsy.  Do not drink alcohol or drive while on this medication.  Lots of rest and fluids.  Continue your albuterol  inhaler as needed.  Please follow-up with your PCP in 2 days for recheck.  Please go to the ER for any worsening symptoms.  I hope you feel better soon!     ED Prescriptions     Medication Sig Dispense Auth. Provider   azithromycin  (ZITHROMAX ) 250 MG tablet Take 1 tablet (250 mg total) by mouth daily. Take first 2 tablets together, then 1 every day until finished. 6 tablet Siri Buege, Jodi R, NP   promethazine -dextromethorphan (PROMETHAZINE -DM) 6.25-15 MG/5ML syrup Take 5 mLs by mouth 4 (four) times daily as needed for cough. 118 mL Shekira Drummer, Jodi R, NP   predniSONE  (DELTASONE ) 20 MG tablet Take 2 tablets (40 mg total) by mouth daily with breakfast for 5 days. 10 tablet Ledora Delker, Jodi R, NP      PDMP not reviewed this encounter.   Loreda Myla SAUNDERS, NP 09/11/23 [phone]

## 2023-09-11 NOTE — ED Triage Notes (Signed)
 Pt presents with c/o chills, sweats, runny nose and headaches X this morning.   Has had a cough x 2 months.  Pt states the sxs have progressed through out the day.

## 2023-09-11 NOTE — Discharge Instructions (Signed)
 Start Zithromax  and prednisone  as prescribed.  You may take Promethazine  DM as needed for cough.  Please note this medication can make you drowsy.  Do not drink alcohol or drive while on this medication.  Lots of rest and fluids.  Continue your albuterol  inhaler as needed.  Please follow-up with your PCP in 2 days for recheck.  Please go to the ER for any worsening symptoms.  I hope you feel better soon!

## 2024-01-22 ENCOUNTER — Ambulatory Visit: Admitting: Nurse Practitioner

## 2024-02-04 ENCOUNTER — Ambulatory Visit
Admission: RE | Admit: 2024-02-04 | Discharge: 2024-02-04 | Disposition: A | Discharge status: Home / Self Care | Source: Ambulatory Visit | Attending: Family Medicine | Admitting: Family Medicine

## 2024-02-04 VITALS — BP 124/81 | HR 78 | Temp 98.1°F | Resp 16

## 2024-02-04 DIAGNOSIS — J4531 Mild persistent asthma with (acute) exacerbation: Secondary | ICD-10-CM | POA: Diagnosis not present

## 2024-02-04 MED ORDER — ALBUTEROL SULFATE HFA 108 (90 BASE) MCG/ACT IN AERS: 1.0000 | INHALATION_SPRAY | Freq: Four times a day (QID) | RESPIRATORY_TRACT | 0 refills | Status: AC | PRN

## 2024-02-04 MED ORDER — PREDNISONE 20 MG PO TABS: ORAL_TABLET | ORAL | 0 refills | Status: DC

## 2024-02-04 NOTE — ED Provider Notes (Signed)
 [facility]  Note:  This document was prepared using Conservation officer, historic buildings and may include unintentional dictation errors.  MRN: [id number] DOB: [birth year]  Subjective:   Kathy Stafford is a 23 y.o. female with past medical history of asthma presenting for acute onset of an asthma attack and listening a coughing spell, chest tightness and back pain.  She was unable to go to work today.  She did use an inhaler that helped her but needs a note for her work.  She has had symptoms through the weekend.  No fever, body aches, sinus symptoms.  Patient does vape daily.   No current facility-administered medications for this encounter.  Current Outpatient Medications:    albuterol  (PROAIR  HFA) 108 (90 Base) MCG/ACT inhaler, Inhale 2 puffs into the lungs every 4 (four) hours as needed for wheezing or shortness of breath., Disp: 18 g, Rfl: 1   azithromycin  (ZITHROMAX ) 250 MG tablet, Take 1 tablet (250 mg total) by mouth daily. Take first 2 tablets together, then 1 every day until finished., Disp: 6 tablet, Rfl: 0   diphenhydrAMINE  (BENADRYL ) 25 MG tablet, Take 2 tablets (50 mg total) by mouth every 6 (six) hours as needed for itching (Please take every 6 hours for the next 24 hours, then every 6 hours as needed.)., Disp: 20 tablet, Rfl: 0   EPINEPHrine  (AUVI-Q ) 0.3 mg/0.3 mL IJ SOAJ injection, Inject 0.3 mg into the muscle once as needed for anaphylaxis. Use as directed for severe allergic reactions, Disp: 1 each, Rfl: 1   fluticasone  (FLONASE ) 50 MCG/ACT nasal spray, Place 2 sprays into both nostrils daily. (Patient taking differently: Place 2 sprays into both nostrils daily as needed for allergies.), Disp: 16 g, Rfl: 5   guaiFENesin  (MUCINEX ) 600 MG 12 hr tablet, Take 600 mg by mouth 2 (two) times daily as needed for cough., Disp: , Rfl:    Hyprom-Naphaz-Polysorb-Zn Sulf (CLEAR EYES COMPLETE) SOLN, Place 1 drop into both eyes daily as needed (redness)., Disp: , Rfl:     levocetirizine (XYZAL ) 5 MG tablet, Take 1 tablet (5 mg total) by mouth every evening. (Patient not taking: Reported on 07/26/2021), Disp: 30 tablet, Rfl: 5   loratadine (CLARITIN) 10 MG tablet, Take 10 mg by mouth daily as needed for allergies., Disp: , Rfl:    promethazine -dextromethorphan (PROMETHAZINE -DM) 6.25-15 MG/5ML syrup, Take 5 mLs by mouth 4 (four) times daily as needed for cough., Disp: 118 mL, Rfl: 0   Allergies  Allergen Reactions   Other Anaphylaxis    Tree Nuts   Peanuts [Peanut Oil] Anaphylaxis    Past Medical History:  Diagnosis Date   Asthma    Eczema      History reviewed. No pertinent surgical history.  Family History  Problem Relation Age of Onset   Lung cancer Maternal Grandmother    Heart disease Maternal Grandmother    Hypertension Maternal Grandmother    Breast cancer Paternal Grandmother    Diabetes Paternal Grandmother    Diabetes Paternal Grandfather     Social History   Tobacco Use   Smoking status: Never   Smokeless tobacco: Never  Vaping Use   Vaping status: Every Day  Substance Use Topics   Alcohol use: Not Currently   Drug use: No    ROS   Objective:   Vitals: BP 124/81 (BP Location: Left Arm)   Pulse 78   Temp 98.1 F (36.7 C) (Oral)   Resp 16   LMP 01/17/2024   SpO2  99%   Physical Exam Constitutional:      General: She is not in acute distress.    Appearance: Normal appearance. She is well-developed. She is not ill-appearing, toxic-appearing or diaphoretic.  HENT:     Head: Normocephalic and atraumatic.     Right Ear: External ear normal.     Left Ear: External ear normal.     Nose: Nose normal.     Mouth/Throat:     Mouth: Mucous membranes are moist.     Pharynx: No pharyngeal swelling, oropharyngeal exudate, posterior oropharyngeal erythema or uvula swelling.     Tonsils: No tonsillar exudate or tonsillar abscesses. 0 on the right. 0 on the left.  Eyes:     General: No scleral icterus.       Right eye: No  discharge.        Left eye: No discharge.     Extraocular Movements: Extraocular movements intact.  Cardiovascular:     Rate and Rhythm: Normal rate and regular rhythm.     Heart sounds: Normal heart sounds. No murmur heard.    No friction rub. No gallop.  Pulmonary:     Effort: Pulmonary effort is normal. No respiratory distress.     Breath sounds: No stridor. No wheezing, rhonchi or rales.  Chest:     Chest wall: No tenderness.  Skin:    General: Skin is warm and dry.  Neurological:     General: No focal deficit present.     Mental Status: She is alert and oriented to person, place, and time.  Psychiatric:        Mood and Affect: Mood normal.        Behavior: Behavior normal.     Assessment and Plan :   PDMP not reviewed this encounter.  1. Mild persistent asthma with (acute) exacerbation    Recommended oral prednisone  course, refilled her albuterol  inhaler. Deferred imaging given clear cardiopulmonary exam, hemodynamically stable vital signs.  Counseled patient on potential for adverse effects with medications prescribed/recommended today, ER and return-to-clinic precautions discussed, patient verbalized understanding.    Adolph Hoop, [place] 02/04/24 1232

## 2024-02-04 NOTE — ED Triage Notes (Signed)
 Pt requested RTW note-states she had a bad asthma attack early this am and unable to work-states she got some relief with inhaler/does not know med name-pt NAD-steady gait

## 2024-02-05 ENCOUNTER — Ambulatory Visit: Payer: Self-pay

## 2024-02-14 ENCOUNTER — Encounter: Admitting: Obstetrics and Gynecology

## 2024-02-19 ENCOUNTER — Ambulatory Visit: Payer: Self-pay | Admitting: Urgent Care

## 2024-02-19 ENCOUNTER — Ambulatory Visit
Admission: EM | Admit: 2024-02-19 | Discharge: 2024-02-19 | Disposition: A | Discharge status: Home / Self Care | Attending: Family Medicine | Admitting: Family Medicine

## 2024-02-19 ENCOUNTER — Ambulatory Visit (INDEPENDENT_AMBULATORY_CARE_PROVIDER_SITE_OTHER)

## 2024-02-19 DIAGNOSIS — M25511 Pain in right shoulder: Secondary | ICD-10-CM

## 2024-02-19 MED ORDER — IBUPROFEN 600 MG PO TABS: 600.0000 mg | ORAL_TABLET | Freq: Four times a day (QID) | ORAL | 0 refills | Status: AC | PRN

## 2024-02-19 MED ORDER — CYCLOBENZAPRINE HCL 5 MG PO TABS: 5.0000 mg | ORAL_TABLET | Freq: Every evening | ORAL | 0 refills | Status: DC | PRN

## 2024-02-19 NOTE — ED Provider Notes (Signed)
 [facility]  Note:  This document was prepared using Conservation officer, historic buildings and may include unintentional dictation errors.  MRN: [id number] DOB: 27-Jun-2001  Subjective:   Kathy Stafford is a 23 y.o. female presenting for acute onset since this morning of moderate to severe right shoulder pain with decreased range of motion.  No fall, trauma.  Patient reports that symptoms started while she was trying to put on her shirt.  Reports that she dislocated her shoulder 1 year ago.  This is by her own personal experience that she did not seek medical treatment.  She has since had intermittent popping of her shoulder with mild to moderate pain.  What brings her into the clinic today is the level of her pain.  No swelling, rashes, fever, wounds, drainage of pus or bleeding, history of musculoskeletal disorders.  No current facility-administered medications for this encounter.  Current Outpatient Medications:    albuterol  (VENTOLIN  HFA) 108 (90 Base) MCG/ACT inhaler, Inhale 1-2 puffs into the lungs every 6 (six) hours as needed for wheezing or shortness of breath., Disp: 18 g, Rfl: 0   azithromycin  (ZITHROMAX ) 250 MG tablet, Take 1 tablet (250 mg total) by mouth daily. Take first 2 tablets together, then 1 every day until finished., Disp: 6 tablet, Rfl: 0   diphenhydrAMINE  (BENADRYL ) 25 MG tablet, Take 2 tablets (50 mg total) by mouth every 6 (six) hours as needed for itching (Please take every 6 hours for the next 24 hours, then every 6 hours as needed.)., Disp: 20 tablet, Rfl: 0   EPINEPHrine  (AUVI-Q ) 0.3 mg/0.3 mL IJ SOAJ injection, Inject 0.3 mg into the muscle once as needed for anaphylaxis. Use as directed for severe allergic reactions, Disp: 1 each, Rfl: 1   fluticasone  (FLONASE ) 50 MCG/ACT nasal spray, Place 2 sprays into both nostrils daily. (Patient taking differently: Place 2 sprays into both nostrils daily as needed for allergies.), Disp: 16 g, Rfl: 5    guaiFENesin  (MUCINEX ) 600 MG 12 hr tablet, Take 600 mg by mouth 2 (two) times daily as needed for cough., Disp: , Rfl:    Hyprom-Naphaz-Polysorb-Zn Sulf (CLEAR EYES COMPLETE) SOLN, Place 1 drop into both eyes daily as needed (redness)., Disp: , Rfl:    levocetirizine (XYZAL ) 5 MG tablet, Take 1 tablet (5 mg total) by mouth every evening. (Patient not taking: Reported on 07/26/2021), Disp: 30 tablet, Rfl: 5   loratadine (CLARITIN) 10 MG tablet, Take 10 mg by mouth daily as needed for allergies., Disp: , Rfl:    predniSONE  (DELTASONE ) 20 MG tablet, Take 2 tablets daily with breakfast., Disp: 10 tablet, Rfl: 0   promethazine -dextromethorphan (PROMETHAZINE -DM) 6.25-15 MG/5ML syrup, Take 5 mLs by mouth 4 (four) times daily as needed for cough., Disp: 118 mL, Rfl: 0   Allergies  Allergen Reactions   Other Anaphylaxis    Tree Nuts   Peanuts [Peanut Oil] Anaphylaxis    Past Medical History:  Diagnosis Date   Asthma    Eczema      History reviewed. No pertinent surgical history.  Family History  Problem Relation Age of Onset   Lung cancer Maternal Grandmother    Heart disease Maternal Grandmother    Hypertension Maternal Grandmother    Breast cancer Paternal Grandmother    Diabetes Paternal Grandmother    Diabetes Paternal Grandfather     Social History   Tobacco Use   Smoking status: Never   Smokeless tobacco: Never  Vaping Use   Vaping status: Every  Day  Substance Use Topics   Alcohol use: Not Currently   Drug use: No    ROS   Objective:   Vitals: BP 115/67 (BP Location: Left Arm)   Pulse 66   Temp 98.7 F (37.1 C) (Oral)   Resp 16   LMP 02/17/2024   SpO2 99%   Physical Exam Constitutional:      General: She is not in acute distress.    Appearance: Normal appearance. She is well-developed. She is not ill-appearing, toxic-appearing or diaphoretic.  HENT:     Head: Normocephalic and atraumatic.     Nose: Nose normal.     Mouth/Throat:     Mouth: Mucous  membranes are moist.   Eyes:     General: No scleral icterus.       Right eye: No discharge.        Left eye: No discharge.     Extraocular Movements: Extraocular movements intact.    Cardiovascular:     Rate and Rhythm: Normal rate.  Pulmonary:     Effort: Pulmonary effort is normal.   Musculoskeletal:     Comments: Extensive guarding of the right shoulder, physical exam not feasible.   Skin:    General: Skin is warm and dry.   Neurological:     General: No focal deficit present.     Mental Status: She is alert and oriented to person, place, and time.   Psychiatric:        Mood and Affect: Mood normal.        Behavior: Behavior normal.    DG Shoulder Right Result Date: 02/19/2024 CLINICAL DATA:  Right shoulder pain. EXAM: RIGHT SHOULDER - 2+ VIEW COMPARISON:  None Available. FINDINGS: Normal glenohumeral acromioclavicular alignment. Joint spaces are maintained. No acute fracture or dislocation. The cortices are intact. The visualized portion of the right lung is unremarkable. IMPRESSION: Normal right shoulder radiographs. Electronically Signed   By: Bertina Broccoli M.D.   On: 02/19/2024 09:51     Assessment and Plan :   PDMP not reviewed this encounter.  1. Acute pain of right shoulder    Patient has acute on chronic right shoulder pain.  Recommend starting ibuprofen , following up with an orthopedist for further management and consideration for more imaging.  Counseled patient on potential for adverse effects with medications prescribed/recommended today, ER and return-to-clinic precautions discussed, patient verbalized understanding.    Adolph Hoop, [place] 02/19/24 [phone]

## 2024-02-19 NOTE — ED Triage Notes (Signed)
 Pt c/o right shoulder pain with hx of dislocation-felt shoulder pop this am while putting in her shirt-NAD-steady gait

## 2024-02-23 ENCOUNTER — Emergency Department (HOSPITAL_COMMUNITY)
Admission: EM | Admit: 2024-02-23 | Discharge: 2024-02-23 | Disposition: A | Discharge status: Home / Self Care | Attending: Emergency Medicine | Admitting: Emergency Medicine

## 2024-02-23 ENCOUNTER — Other Ambulatory Visit: Payer: Self-pay

## 2024-02-23 ENCOUNTER — Encounter (HOSPITAL_COMMUNITY): Payer: Self-pay

## 2024-02-23 DIAGNOSIS — Z91010 Allergy to peanuts: Secondary | ICD-10-CM | POA: Diagnosis not present

## 2024-02-23 DIAGNOSIS — Z7951 Long term (current) use of inhaled steroids: Secondary | ICD-10-CM | POA: Diagnosis not present

## 2024-02-23 DIAGNOSIS — J45909 Unspecified asthma, uncomplicated: Secondary | ICD-10-CM | POA: Diagnosis not present

## 2024-02-23 DIAGNOSIS — R000 Tachycardia, unspecified: Secondary | ICD-10-CM | POA: Diagnosis not present

## 2024-02-23 DIAGNOSIS — L500 Allergic urticaria: Secondary | ICD-10-CM | POA: Insufficient documentation

## 2024-02-23 DIAGNOSIS — T7840XA Allergy, unspecified, initial encounter: Secondary | ICD-10-CM

## 2024-02-23 MED ORDER — METHYLPREDNISOLONE SODIUM SUCC 125 MG IJ SOLR
125.0000 mg | INTRAMUSCULAR | Status: AC
  Administered 2024-02-23: 125 mg via INTRAVENOUS
  Filled 2024-02-23: qty 2

## 2024-02-23 MED ORDER — EPINEPHRINE 0.3 MG/0.3ML IJ SOAJ
0.3000 mg | Freq: Once | INTRAMUSCULAR | Status: AC
Start: 2024-02-23 — End: 2024-02-23

## 2024-02-23 MED ORDER — SODIUM CHLORIDE 0.9 % IV BOLUS
1000.0000 mL | Freq: Once | INTRAVENOUS | Status: AC
  Administered 2024-02-23: 1000 mL via INTRAVENOUS

## 2024-02-23 MED ORDER — FAMOTIDINE IN NACL 20-0.9 MG/50ML-% IV SOLN
20.0000 mg | Freq: Once | INTRAVENOUS | Status: AC
  Administered 2024-02-23: 20 mg via INTRAVENOUS
  Filled 2024-02-23: qty 50

## 2024-02-23 MED ORDER — EPINEPHRINE 0.3 MG/0.3ML IJ SOAJ
INTRAMUSCULAR | Status: AC
  Administered 2024-02-23: 0.3 mg via INTRAMUSCULAR
  Filled 2024-02-23: qty 0.3

## 2024-02-23 MED ORDER — EPINEPHRINE 0.3 MG/0.3ML IJ SOAJ: 0.3000 mg | INTRAMUSCULAR | 0 refills | Status: AC | PRN

## 2024-02-23 MED ORDER — DIPHENHYDRAMINE HCL 50 MG/ML IJ SOLN
25.0000 mg | Freq: Once | INTRAMUSCULAR | Status: AC
Start: 2024-02-23 — End: 2024-02-23
  Administered 2024-02-23: 25 mg via INTRAVENOUS
  Filled 2024-02-23: qty 1

## 2024-02-23 MED ORDER — IPRATROPIUM-ALBUTEROL 0.5-2.5 (3) MG/3ML IN SOLN
3.0000 mL | Freq: Once | RESPIRATORY_TRACT | Status: AC
  Administered 2024-02-23: 3 mL via RESPIRATORY_TRACT
  Filled 2024-02-23: qty 3

## 2024-02-23 NOTE — ED Triage Notes (Signed)
 Patient reports sudden onset generalized skin itching with urticaria and redness / facial numbness this evening after drinking alcohol .

## 2024-02-23 NOTE — ED Provider Notes (Signed)
 [place] [facility]PARTMENT AT [facility] Provider Note   CSN: [id number] Arrival date & time: 02/23/24  9980     Patient presents with: Allergic Reaction   Kathy Stafford is a 23 y.o. 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  The patient presents to the ED for evaluation of allergic reaction.  States that she was at the bar with her friend where they were having drinks that had tequila and then when she had sudden onset of facial swelling, hives throughout her upper extremities.  She reports a history of anaphylaxis in the past to tree nuts.  She reports being seen by allergist in the past.  She denies any recent exposures to new lotions, detergents, body washes.  Denies any new food exposures.  Reports a history of anaphylaxis.  Reports that she currently feels as if her throat is closing and she is short of breath.  She is actively wheezing on exam.  Denies any drug use tonight.   Allergic Reaction Presenting symptoms: difficulty swallowing and rash        Prior to Admission medications   Medication Sig Start Date End Date Taking? Authorizing Provider  EPINEPHrine  0.3 mg/0.3 mL IJ SOAJ injection Inject 0.3 mg into the muscle as needed for anaphylaxis. 02/23/24  Yes Ruthell Lonni FALCON, PA-C  albuterol  (VENTOLIN  HFA) 108 (90 Base) MCG/ACT inhaler Inhale 1-2 puffs into the lungs every 6 (six) hours as needed for wheezing or shortness of breath. 02/04/24   Gjon Letarte Savannah, PA-C  azithromycin  (ZITHROMAX ) 250 MG tablet Take 1 tablet (250 mg total) by mouth daily. Take first 2 tablets together, then 1 every day until finished. 09/11/23   Mayer, Jodi R, NP  cyclobenzaprine (FLEXERIL) 5 MG tablet Take 1 tablet (5 mg total) by mouth at bedtime as needed. 02/19/24   Deberah Adolf Savannah, PA-C  diphenhydrAMINE  (BENADRYL ) 25 MG tablet Take 2 tablets (50 mg total) by mouth every 6 (six) hours as needed for itching (Please take every 6 hours for the next 24 hours, then  every 6 hours as needed.). 08/22/13   Elpidio Lamp, MD  fluticasone  (FLONASE ) 50 MCG/ACT nasal spray Place 2 sprays into both nostrils daily. Patient taking differently: Place 2 sprays into both nostrils daily as needed for allergies. 06/11/18   Bobbitt, Elgin Pepper, MD  guaiFENesin  (MUCINEX ) 600 MG 12 hr tablet Take 600 mg by mouth 2 (two) times daily as needed for cough.    [provider]  Hyprom-Naphaz-Polysorb-Zn Sulf (CLEAR EYES COMPLETE) SOLN Place 1 drop into both eyes daily as needed (redness).    [provider]  ibuprofen  (ADVIL ) 600 MG tablet Take 1 tablet (600 mg total) by mouth every 6 (six) hours as needed. 02/19/24   Znya Albino Savannah, PA-C  levocetirizine (XYZAL ) 5 MG tablet Take 1 tablet (5 mg total) by mouth every evening. Patient not taking: Reported on 07/26/2021 06/11/18   Bobbitt, Elgin Pepper, MD  loratadine (CLARITIN) 10 MG tablet Take 10 mg by mouth daily as needed for allergies.    [provider]  predniSONE  (DELTASONE ) 20 MG tablet Take 2 tablets daily with breakfast. 02/04/24   Kamali Nephew Savannah, PA-C  promethazine -dextromethorphan (PROMETHAZINE -DM) 6.25-15 MG/5ML syrup Take 5 mLs by mouth 4 (four) times daily as needed for cough. 09/11/23   Mayer, Jodi R, NP    Allergies: Other and Peanuts [peanut oil]    Review of Systems  HENT:  Positive for trouble swallowing.   Respiratory:  Positive for shortness  of breath.   Cardiovascular:  Negative for chest pain.  Gastrointestinal:  Negative for nausea and vomiting.  Skin:  Positive for rash.  All other systems reviewed and are negative.   Updated Vital Signs BP 127/89   Pulse 88   Temp 97.8 F (36.6 C)   Resp 17   Ht 5' 7 (1.702 m)   Wt 70.6 kg   LMP 02/17/2024   SpO2 100%   BMI 24.38 kg/m   Physical Exam Vitals and nursing note reviewed.  Constitutional:      General: She is not in acute distress.    Appearance: She is well-developed.  HENT:     Head: Normocephalic and atraumatic.      Mouth/Throat:     Comments: Uvula midline.  Handling secretions appropriately.  No drooling or change phonation.  Eyes:     Conjunctiva/sclera: Conjunctivae normal.    Cardiovascular:     Rate and Rhythm: Regular rhythm. Tachycardia present.     Heart sounds: No murmur heard. Pulmonary:     Effort: Pulmonary effort is normal. No respiratory distress.     Breath sounds: Normal breath sounds.  Abdominal:     Palpations: Abdomen is soft.     Tenderness: There is no abdominal tenderness.   Musculoskeletal:        General: No swelling.     Cervical back: Neck supple.   Skin:    General: Skin is warm and dry.     Capillary Refill: Capillary refill takes less than 2 seconds.     Comments: Urticarial hives to bilateral upper extremities   Neurological:     Mental Status: She is alert and oriented to person, place, and time. Mental status is at baseline.   Psychiatric:        Mood and Affect: Mood normal.     (all labs ordered are listed, but only abnormal results are displayed) Labs Reviewed - No data to display  EKG: None  Radiology: No results found.   Procedures   Medications Ordered in the ED  EPINEPHrine  (EPI-PEN) injection 0.3 mg (0.3 mg Intramuscular Given 02/23/24 0045)  methylPREDNISolone  sodium succinate (SOLU-MEDROL ) 125 mg/2 mL injection 125 mg (125 mg Intravenous Given 02/23/24 0043)  famotidine  (PEPCID ) IVPB 20 mg premix (0 mg Intravenous Stopped 02/23/24 0113)  diphenhydrAMINE  (BENADRYL ) injection 25 mg (25 mg Intravenous Given 02/23/24 0043)  sodium chloride 0.9 % bolus 1,000 mL (0 mLs Intravenous Stopped 02/23/24 0150)  ipratropium-albuterol  (DUONEB) 0.5-2.5 (3) MG/3ML nebulizer solution 3 mL (3 mLs Nebulization Given 02/23/24 0044)    Clinical Course as of 02/23/24 0356  Sat Feb 23, 2024  0135 Reports symptoms are resolved at this time.  Patient will be observed for 4 hours. [CG]  0135 Can be discharged at 4:45 AM. [CG]    Clinical Course User  Index [CG] Ruthell Lonni FALCON, PA-C   Medical Decision Making Risk Prescription drug management.   23 year old female presents for evaluation.  Please see HPI for further details.  On examination the patient is afebrile, tachycardic.  Her lung sounds are clear bilaterally, she is not hypoxic.  Her abdomen is soft and compressible.  Neurological examination at baseline.  Uvula midline, handling secretions appropriately, no drooling, no change phonation.  Urticarial hives present to bilateral per extremities.  Patient presents stating she has history of anaphylaxis.  Reports that she feels as if her throat is closing.  Patient was given 0.3 mg IM epinephrine  at this time.  This was completed at  12:45 AM.  Patient was then given Solu-Medrol , Benadryl  and famotidine .  Patient was observed for 3 hours and 35 minutes.  Patient requests discharge at this time.  I advised the patient that typically we observe patient for 4 hours to ensure no rebound symptoms however she is still requesting discharge.  She has had resolution of her symptoms here.  She states she has no trouble swallowing, feelings of her throat closing.  She states that her hives have resolved.  Patient be discharged home at this time.  Will send patient home with epinephrine  pen.  She was advised to follow-up with her PCP and allergy team and she voiced understanding.  She is stable to discharge    Final diagnoses:  Allergic reaction, initial encounter    ED Discharge Orders          Ordered    EPINEPHrine  0.3 mg/0.3 mL IJ SOAJ injection  As needed        02/23/24 0356               Ruthell Lonni FALCON, PA-C 02/23/24 9643    Geroldine Berg, MD 02/23/24 [phone]

## 2024-02-23 NOTE — Discharge Instructions (Signed)
 It was a pleasure taking part in your care.  As discussed, please follow-up with your PCP and allergy team for further management and care.  I have discharged you with an EpiPen .  Please pick this up at your earliest convenience.  Return to the ED with any new or worsening symptoms.

## 2024-07-17 ENCOUNTER — Ambulatory Visit: Admitting: Family Medicine

## 2024-07-21 ENCOUNTER — Telehealth

## 2024-07-21 DIAGNOSIS — L710 Perioral dermatitis: Secondary | ICD-10-CM

## 2024-07-21 DIAGNOSIS — T380X5A Adverse effect of glucocorticoids and synthetic analogues, initial encounter: Secondary | ICD-10-CM | POA: Diagnosis not present

## 2024-07-21 MED ORDER — METRONIDAZOLE 0.75 % EX CREA: TOPICAL_CREAM | Freq: Two times a day (BID) | CUTANEOUS | 0 refills | Status: DC

## 2024-07-21 NOTE — Progress Notes (Signed)
 We are sorry that you are experiencing this issue.  Here is how we plan to help!  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   Your personal care plan consists of the following recommendations:  I have prescribed a topical antibiotic treatment: Metronidazole 0.75% cream Apply topically twice daily   HOME CARE: Do not squeeze pimples because that can often lead to infections, worse acne, and scars. Use a moisturizer that contains retinoid or fruit acids that may inhibit the development of new acne lesions. Although there is not a clear link that foods can cause acne, doctors do believe that too many sweets predispose you to skin problems.  GET HELP RIGHT AWAY IF: If your acne gets worse or is not better within 10 days. If you become depressed. If you become pregnant, discontinue medications and call your OB/GYN.  MAKE SURE YOU: Understand these instructions. Will watch your condition. Will get help right away if you are not doing well or get worse.   Your e-visit answers were reviewed by a board certified advanced clinical practitioner to complete your personal care plan.  Depending upon the condition, your plan could have included both over the counter or prescription medications.  Please review your pharmacy choice.  If there is a problem, you may contact your provider through [employer] and have the prescription routed to another pharmacy.  Your safety is important to us .  If you have drug allergies check your prescription carefully.  For the next 24 hours you can use MyChart to ask questions about today's visit, request a non-urgent call back, or ask for a work or school excuse from your e-visit provider.  You will get an email in the next two days asking about your experience. I hope that your e-visit has been valuable and will speed your recovery.  I have spent 5 minutes in review of e-visit questionnaire, review and updating patient  chart, medical decision making and response to patient.   Delon CHRISTELLA Dickinson, PA-C

## 2024-08-17 ENCOUNTER — Telehealth

## 2024-08-17 DIAGNOSIS — L710 Perioral dermatitis: Secondary | ICD-10-CM | POA: Diagnosis not present

## 2024-08-18 MED ORDER — PREDNISONE 10 MG (21) PO TBPK: ORAL_TABLET | ORAL | 0 refills | Status: DC

## 2024-08-18 NOTE — Progress Notes (Signed)
 E Visit for Rash  We are sorry that you are not feeling well. Here is how we plan to help!  Stop using topical steroid Cortisone on the face. Topical steroids on the face long-term can cause this rash.   I am prescribing : Prednisone  10 mg daily for 6 days (see taper instructions below)  Directions for 6 day taper: Day 1: 2 tablets before breakfast, 1 after both lunch & dinner and 2 at bedtime Day 2: 1 tab before breakfast, 1 after both lunch & dinner and 2 at bedtime Day 3: 1 tab at each meal & 1 at bedtime Day 4: 1 tab at breakfast, 1 at lunch, 1 at bedtime Day 5: 1 tab at breakfast & 1 tab at bedtime Day 6: 1 tab at breakfast   HOME CARE:  Take cool showers and avoid direct sunlight. Apply cool compress or wet dressings. Take a bath in an oatmeal bath.  Sprinkle content of one Aveeno packet under running faucet with comfortably warm water.  Bathe for 15-20 minutes, 1-2 times daily.  Pat dry with a towel. Do not rub the rash. Use hydrocortisone cream. Take an antihistamine like Benadryl  for widespread rashes that itch.  The adult dose of Benadryl  is 25-50 mg by mouth 4 times daily. Caution:  This type of medication may cause sleepiness.  Do not drink alcohol, drive, or operate dangerous machinery while taking antihistamines.  Do not take these medications if you have prostate enlargement.  Read package instructions thoroughly on all medications that you take.  GET HELP RIGHT AWAY IF:  Symptoms don't go away after treatment. Severe itching that persists. If you rash spreads or swells. If you rash begins to smell. If it blisters and opens or develops a yellow-brown crust. You develop a fever. You have a sore throat. You become short of breath.  MAKE SURE YOU:  Understand these instructions. Will watch your condition. Will get help right away if you are not doing well or get worse.  Thank you for choosing an e-visit. Your e-visit answers were reviewed by a board certified  advanced clinical practitioner to complete your personal care plan. Depending upon the condition, your plan could have included both over the counter or prescription medications. Please review your pharmacy choice. Be sure that the pharmacy you have chosen is open so that you can pick up your prescription now.  If there is a problem you may message your provider in MyChart to have the prescription routed to another pharmacy. Your safety is important to us . If you have drug allergies check your prescription carefully.  For the next 24 hours, you can use MyChart to ask questions about todays visit, request a non-urgent call back, or ask for a work or school excuse from your e-visit provider. You will get an email in the next two days asking about your experience. I hope that your e-visit has been valuable and will speed your recovery.  I have spent 5 minutes in review of e-visit questionnaire, review and updating patient chart, medical decision making and response to patient.   Delon CHRISTELLA Dickinson, PA-C

## 2024-09-02 ENCOUNTER — Encounter: Payer: Self-pay | Admitting: Obstetrics and Gynecology

## 2024-09-02 ENCOUNTER — Ambulatory Visit: Admitting: Obstetrics and Gynecology

## 2024-09-02 ENCOUNTER — Other Ambulatory Visit (HOSPITAL_COMMUNITY)
Admission: RE | Admit: 2024-09-02 | Discharge: 2024-09-02 | Disposition: A | Source: Ambulatory Visit | Attending: Family Medicine | Admitting: Family Medicine

## 2024-09-02 VITALS — BP 107/69 | HR 85 | Ht 67.0 in | Wt 134.0 lb

## 2024-09-02 DIAGNOSIS — Z124 Encounter for screening for malignant neoplasm of cervix: Secondary | ICD-10-CM

## 2024-09-02 DIAGNOSIS — R6882 Decreased libido: Secondary | ICD-10-CM

## 2024-09-02 NOTE — Patient Instructions (Signed)
 [facility],  [address] [employer], [address]  [phone]
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
## 2024-09-09 LAB — CYTOLOGY - PAP
Diagnosis: NEGATIVE
Diagnosis: REACTIVE

## 2024-09-10 ENCOUNTER — Ambulatory Visit: Payer: Self-pay | Admitting: Obstetrics and Gynecology

## 2024-09-12 ENCOUNTER — Encounter: Admitting: Obstetrics and Gynecology
